# Patient Record
Sex: Female | Born: 1975 | Race: White | Hispanic: No | Marital: Married
Health system: Midwestern US, Academic
[De-identification: ages and names within clinical notes are randomized; demographics above are authoritative.]

---

## 2014-12-24 IMAGING — CR CHEST
2 series · 2 of 2 positions shown · non-contrast
Comparison: None available

CHEST 2 VIEWS
INDICATION: History of pneumonia Klpigbb, lingering symptoms and wheezing
TECHNIQUE: PA and lateral projections.

[chest pa]
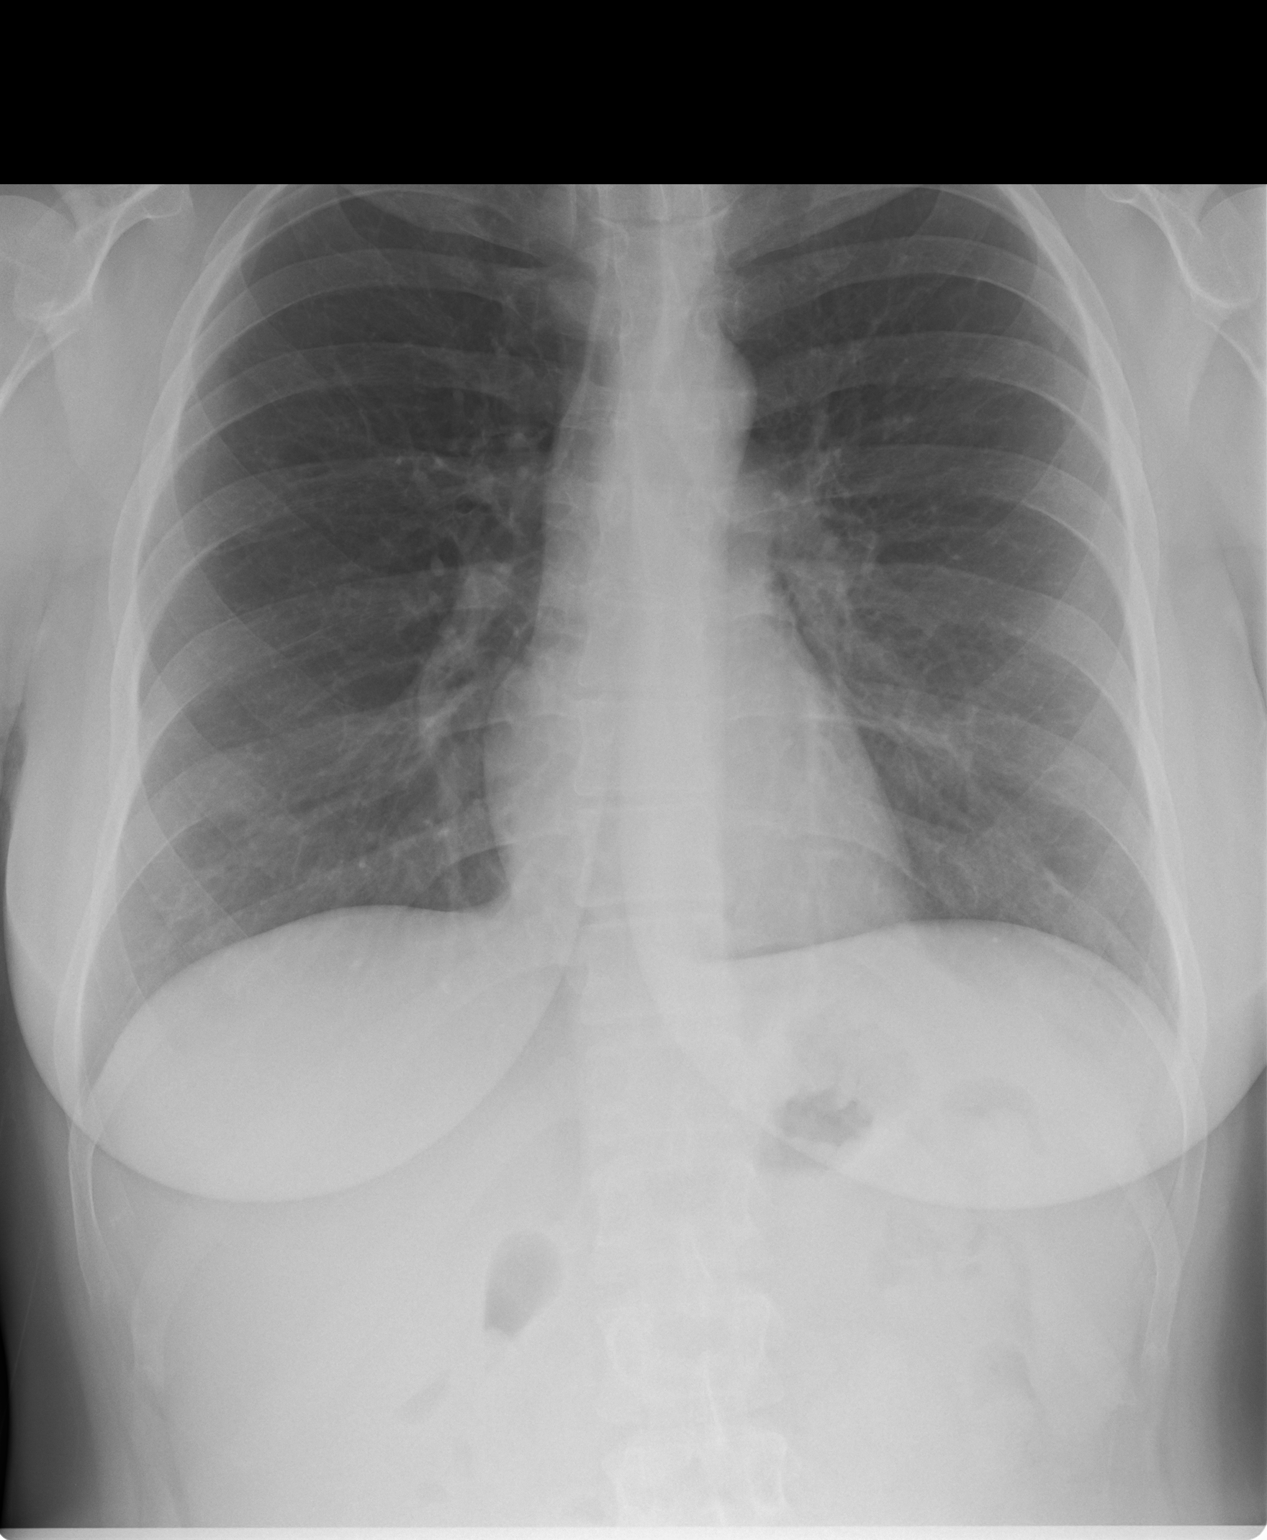

[chest lat]
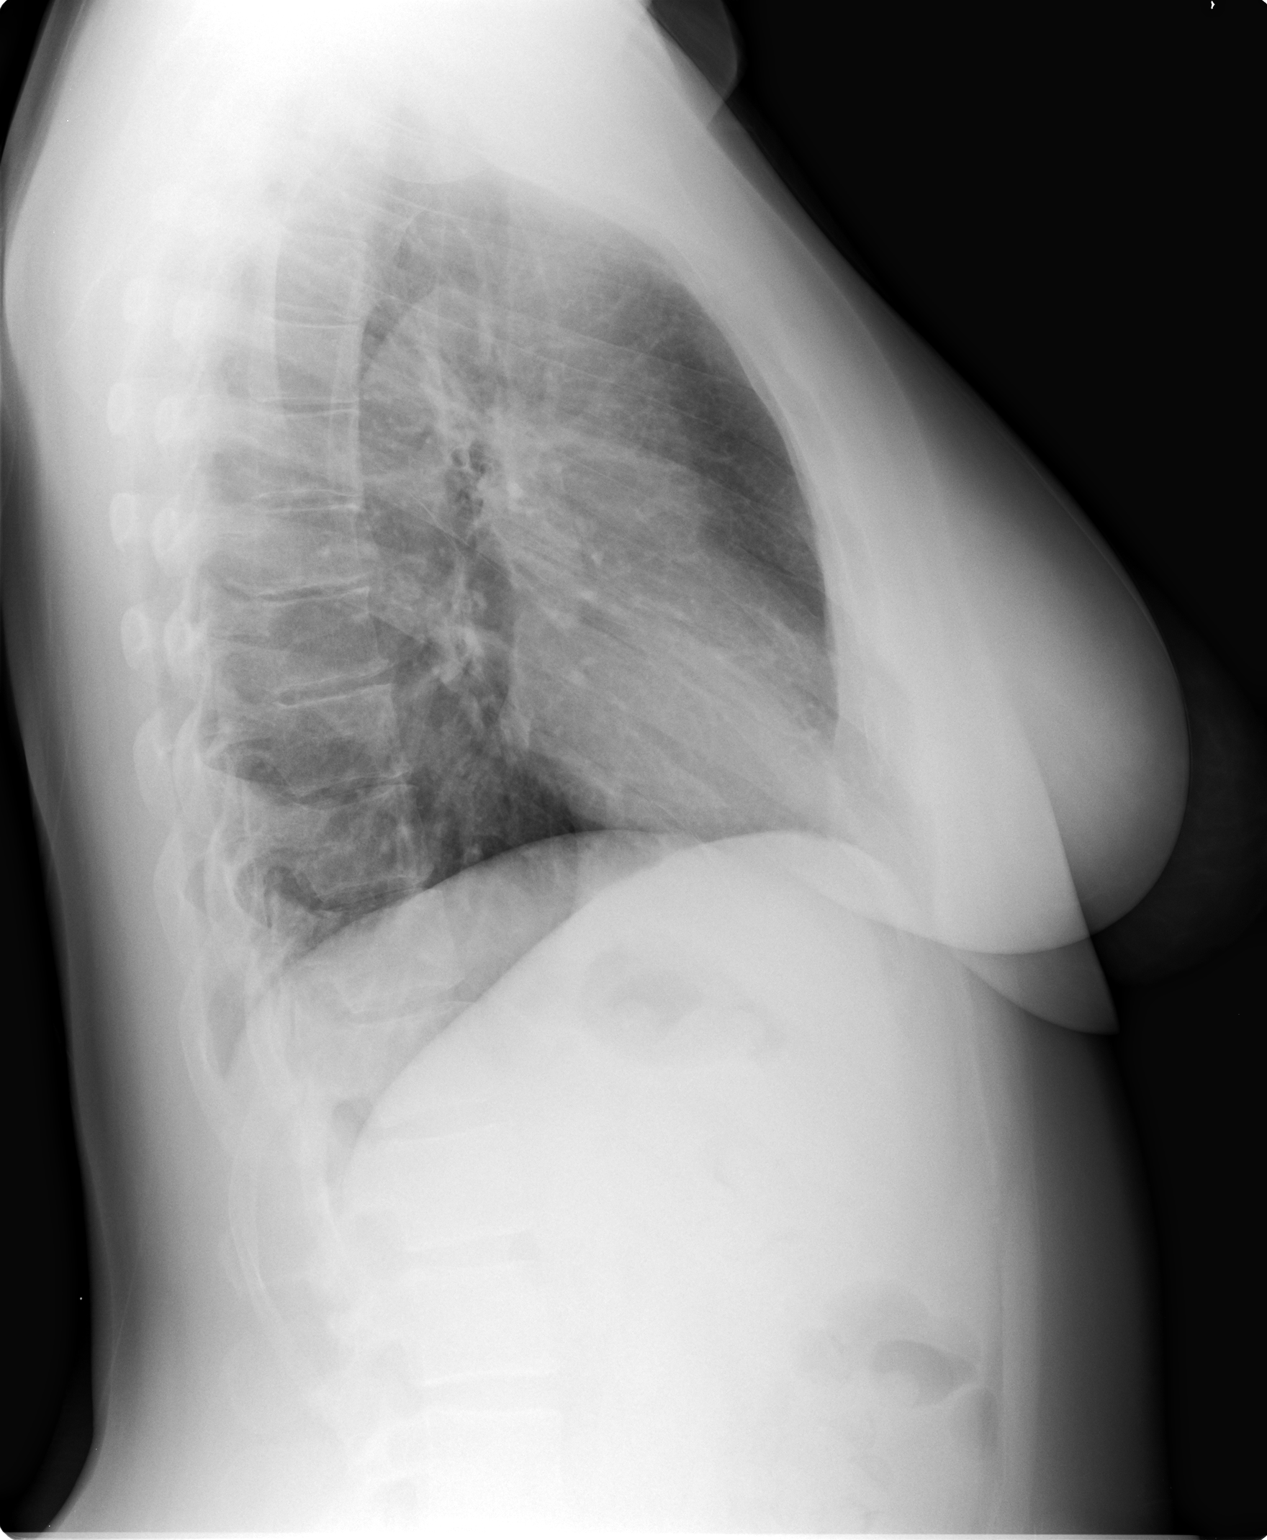

[2 of 2 positions shown; findings below may reference images not displayed]

IMPRESSION: No active process
FINDINGS: There is mid inspiration.
There is some slight non congestive prominence of the basilar bronchovascular
markings without thickening of softening
The lung fields reveal no acute peripheral process.
The heart and mediastinum are physiologic in size and contour

Tech Notes: HX: PNEUMONIA IN SEPTEMBER. LINGERING SYMPTOMS. WHEZZING. PT DENIES PREGNANCY. PT
SHIELDED.

## 2017-03-02 ENCOUNTER — Encounter: Admit: 2017-03-02 | Discharge: 2017-03-02 | Payer: BC Managed Care – PPO

## 2017-03-08 MED ORDER — SERTRALINE 100 MG PO TAB
ORAL_TABLET | Freq: Every day | 23 refills
Start: 2017-03-08 — End: ?

## 2017-03-08 NOTE — Telephone Encounter
Looks like medication was removed from her med list. Is she still taking it? Not discussed during her last OV w/ Dr. Albesa SeenBaldassaro. Please get more info. Would recommend establishing with PCP for long term management of antidepressant.
